# Patient Record
Sex: Male | Born: 2000 | Race: White | Hispanic: No | Marital: Single | Smoking: Never smoker
Health system: Southern US, Community
[De-identification: ages and names within clinical notes are randomized; demographics above are authoritative.]

## PROBLEM LIST (undated history)

## (undated) DIAGNOSIS — J302 Other seasonal allergic rhinitis: Secondary | ICD-10-CM

## (undated) HISTORY — PX: NO PAST SURGERIES: SHX2092

---
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
## 2018-11-12 DIAGNOSIS — M50223 Other cervical disc displacement at C6-C7 level: Secondary | ICD-10-CM | POA: Diagnosis not present

## 2018-11-12 DIAGNOSIS — M9901 Segmental and somatic dysfunction of cervical region: Secondary | ICD-10-CM | POA: Diagnosis not present

## 2018-11-12 DIAGNOSIS — M9902 Segmental and somatic dysfunction of thoracic region: Secondary | ICD-10-CM | POA: Diagnosis not present

## 2018-11-17 DIAGNOSIS — M50223 Other cervical disc displacement at C6-C7 level: Secondary | ICD-10-CM | POA: Diagnosis not present

## 2018-11-17 DIAGNOSIS — M9902 Segmental and somatic dysfunction of thoracic region: Secondary | ICD-10-CM | POA: Diagnosis not present

## 2018-11-17 DIAGNOSIS — M9901 Segmental and somatic dysfunction of cervical region: Secondary | ICD-10-CM | POA: Diagnosis not present

## 2018-11-19 DIAGNOSIS — M50223 Other cervical disc displacement at C6-C7 level: Secondary | ICD-10-CM | POA: Diagnosis not present

## 2018-11-19 DIAGNOSIS — M9902 Segmental and somatic dysfunction of thoracic region: Secondary | ICD-10-CM | POA: Diagnosis not present

## 2018-11-19 DIAGNOSIS — M9901 Segmental and somatic dysfunction of cervical region: Secondary | ICD-10-CM | POA: Diagnosis not present

## 2018-11-24 DIAGNOSIS — M9901 Segmental and somatic dysfunction of cervical region: Secondary | ICD-10-CM | POA: Diagnosis not present

## 2018-11-24 DIAGNOSIS — M50223 Other cervical disc displacement at C6-C7 level: Secondary | ICD-10-CM | POA: Diagnosis not present

## 2018-11-24 DIAGNOSIS — M9902 Segmental and somatic dysfunction of thoracic region: Secondary | ICD-10-CM | POA: Diagnosis not present

## 2018-11-26 DIAGNOSIS — M9901 Segmental and somatic dysfunction of cervical region: Secondary | ICD-10-CM | POA: Diagnosis not present

## 2018-11-26 DIAGNOSIS — M50223 Other cervical disc displacement at C6-C7 level: Secondary | ICD-10-CM | POA: Diagnosis not present

## 2018-11-26 DIAGNOSIS — M9902 Segmental and somatic dysfunction of thoracic region: Secondary | ICD-10-CM | POA: Diagnosis not present

## 2018-12-01 DIAGNOSIS — M9902 Segmental and somatic dysfunction of thoracic region: Secondary | ICD-10-CM | POA: Diagnosis not present

## 2018-12-01 DIAGNOSIS — M9901 Segmental and somatic dysfunction of cervical region: Secondary | ICD-10-CM | POA: Diagnosis not present

## 2018-12-01 DIAGNOSIS — M50223 Other cervical disc displacement at C6-C7 level: Secondary | ICD-10-CM | POA: Diagnosis not present

## 2019-09-13 ENCOUNTER — Other Ambulatory Visit: Payer: Self-pay

## 2019-09-13 DIAGNOSIS — Z20822 Contact with and (suspected) exposure to covid-19: Secondary | ICD-10-CM

## 2019-09-14 LAB — NOVEL CORONAVIRUS, NAA: SARS-CoV-2, NAA: NOT DETECTED

## 2020-09-28 ENCOUNTER — Ambulatory Visit (INDEPENDENT_AMBULATORY_CARE_PROVIDER_SITE_OTHER): Payer: BC Managed Care – PPO

## 2020-09-28 ENCOUNTER — Ambulatory Visit
Admission: RE | Admit: 2020-09-28 | Discharge: 2020-09-28 | Disposition: A | Discharge status: Home / Self Care | Payer: BC Managed Care – PPO | Source: Ambulatory Visit | Attending: Emergency Medicine | Admitting: Emergency Medicine

## 2020-09-28 ENCOUNTER — Other Ambulatory Visit: Payer: Self-pay

## 2020-09-28 VITALS — BP 136/74 | HR 68 | Temp 98.1°F | Resp 18 | Ht 69.0 in | Wt 190.0 lb

## 2020-09-28 DIAGNOSIS — S99922A Unspecified injury of left foot, initial encounter: Secondary | ICD-10-CM

## 2020-09-28 HISTORY — DX: Other seasonal allergic rhinitis: J30.2

## 2020-09-28 NOTE — ED Provider Notes (Signed)
HPI  SUBJECTIVE:  Todd Leblanc is a 19 y.o. male who presents with 2 days of dorsal left midfoot pain after 45 pound weight plate fell onto his bare foot.  He states the pain is getting better, and can walk on it now.  He reports bruising, erythema, which has resolved.  Denies swelling, distal numbness or tingling.  No injury to the ankle.  States that he is to move his toes without any problem.  He tried ice with improvement in his symptoms.  He has also stayed active.  Symptoms are worse with weightbearing.  Past medical history negative for left foot injury, diabetes, hypertension, osteoporosis, smoking.  PMD: Dr. Audelia Acton.   Past Medical History:  Diagnosis Date  . Seasonal allergies     Past Surgical History:  Procedure Laterality Date  . NO PAST SURGERIES      Family History  Problem Relation Age of Onset  . Healthy Mother   . Healthy Father     Social History   Tobacco Use  . Smoking status: Never Smoker  . Smokeless tobacco: Never Used  Vaping Use  . Vaping Use: Never used  Substance Use Topics  . Alcohol use: Yes    Comment: occasional  . Drug use: Never    No current facility-administered medications for this encounter.  Current Outpatient Medications:  .  cetirizine (ZYRTEC) 10 MG tablet, Take 1 tablet by mouth daily as needed., Disp: , Rfl:  .  triamcinolone (NASACORT) 55 MCG/ACT AERO nasal inhaler, Place 2 sprays into the nose daily as needed., Disp: , Rfl:   No Known Allergies   ROS  As noted in HPI.   Physical Exam  BP 136/74 (BP Location: Left Arm)   Pulse 68   Temp 98.1 F (36.7 C) (Oral)   Resp 18   Ht 5\' 9"  (1.753 m)   Wt 86.2 kg   SpO2 100%   BMI 28.06 kg/m   Constitutional: Well developed, well nourished, no acute distress Eyes:  EOMI, conjunctiva normal bilaterally HENT: Normocephalic, atraumatic,mucus membranes moist Respiratory: Normal inspiratory effort Cardiovascular: Normal rate GI: nondistended skin: No rash, skin  intact Musculoskeletal:  Left medial midfoot tender. Base of fifth metatarsal NT/ tender. No bruising. Skin intact. DP 2+. Refill less than 2 seconds. Sensation grossly intact. Patient able to move all toes actively.   no pain with inversion / eversion,  dorsiflexion / plantarflexion. No Tenderness along the plantar fascia. Distal fibula NT, Medial malleolus NT,  Deltoid ligament NT, Lateral ligaments NT, Achilles NT. Patient able to bear weight while in department. Neurologic: Alert & oriented x 3, no focal neuro deficits Psychiatric: Speech and behavior appropriate   ED Course   Medications - No data to display  Orders Placed This Encounter  Procedures  . DG Foot Complete Left    Standing Status:   Standing    Number of Occurrences:   1    Order Specific Question:   Reason for Exam (SYMPTOM  OR DIAGNOSIS REQUIRED)    Answer:   45 lb weight fell on foot DOI 09/26/20    No results found for this or any previous visit (from the past 24 hour(s)). DG Foot Complete Left  Result Date: 09/28/2020 CLINICAL DATA:  Injury to foot 1 week ago EXAM: LEFT FOOT - COMPLETE 3+ VIEW COMPARISON:  None. FINDINGS: There is no evidence of fracture or dislocation. There is no evidence of arthropathy or other focal bone abnormality. Mild soft tissue swelling over the  dorsum of the midfoot. IMPRESSION: Mild soft tissue swelling without evidence of acute fracture or dislocation. Electronically Signed   By: Duanne Guess D.O.   On: 09/28/2020 13:28    ED Clinical Impression  1. Injury of left foot, initial encounter      ED Assessment/Plan  Status post crush injury to the foot.  Will image foot to rule out partial fracture.  If negative, then patient can take Tylenol/ibuprofen together as needed for pain, resume activity as tolerated.  States he does not need a prescription for ibuprofen.  Reviewed imaging independently.  Mild soft tissue swelling.  No fracture or dislocation.  See radiology report for  full details.  Normal x-ray.  Plan as above.  Follow-up with PMD as needed.   No orders of the defined types were placed in this encounter.   *This clinic note was created using Dragon dictation software. Therefore, there may be occasional mistakes despite careful proofreading.   ?    Domenick Gong, MD 09/28/20 1404

## 2020-09-28 NOTE — ED Triage Notes (Signed)
Patient in today c/o left foot pain x 2 days. Patient states he was working out on 09/26/20 and dropped a 45lb weight onto his foot.

## 2020-09-28 NOTE — Discharge Instructions (Addendum)
Your x-ray was negative for fracture or dislocation.  You may take 600 mg of ibuprofen combined with 1000 mg of Tylenol 3-4 times a day as needed for pain.  Keep this in mind for the future.

## 2022-01-16 IMAGING — CR DG FOOT COMPLETE 3+V*L*
4 series · 4 of 4 positions shown · non-contrast
Comparison: None.

CLINICAL DATA: Injury to foot 1 week ago

EXAM:
LEFT FOOT - COMPLETE 3+ VIEW

[foot ap]
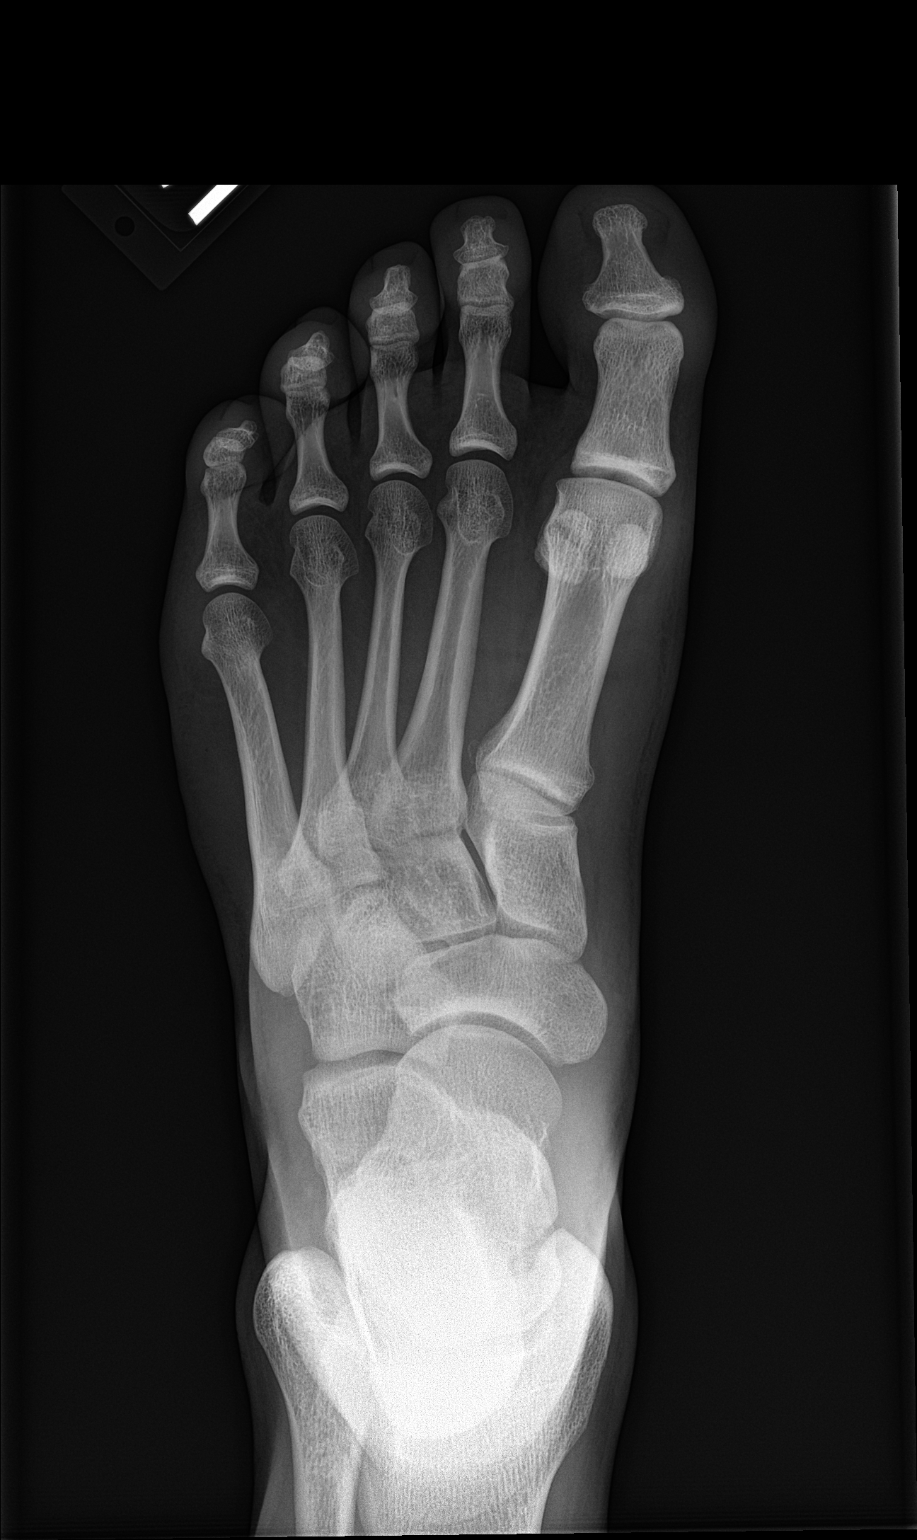

[foot obl]
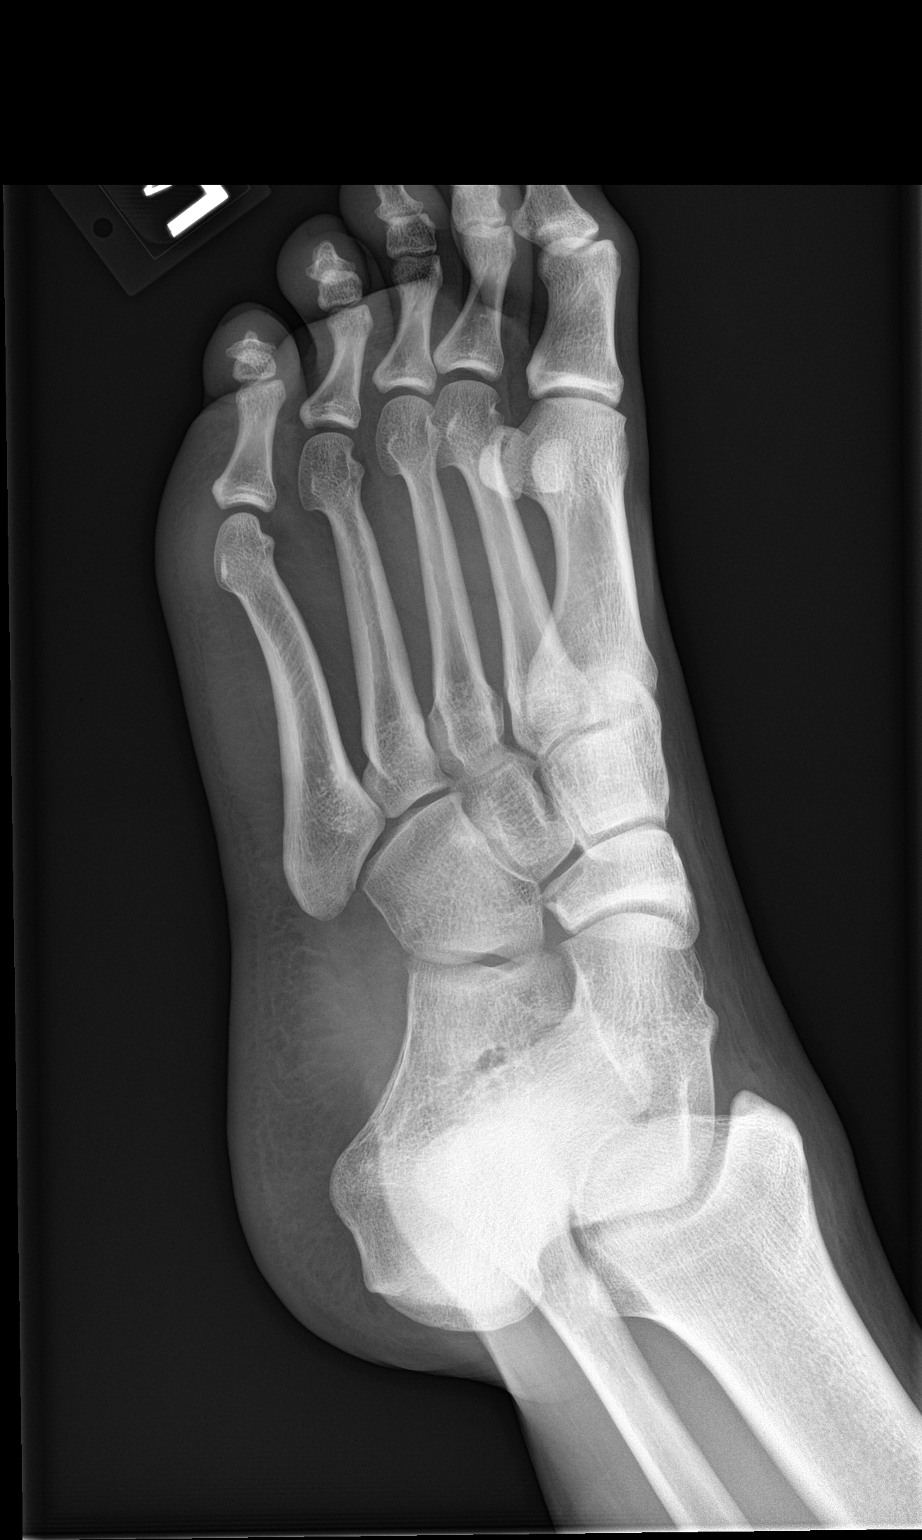

[foot lat (1 of 2)]
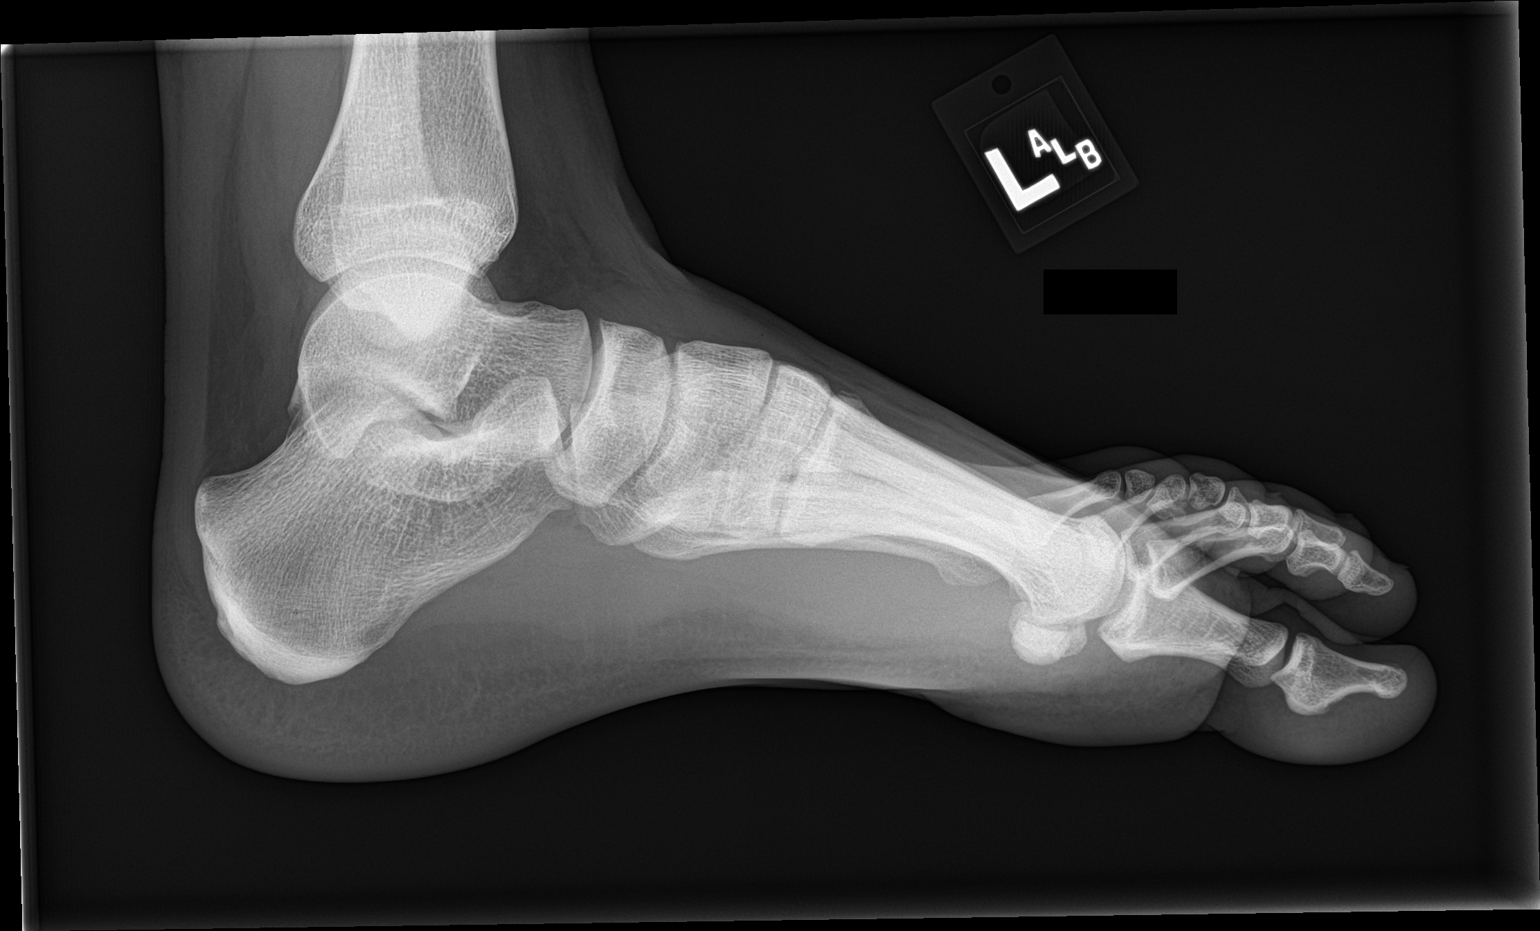

[foot lat (2 of 2)]
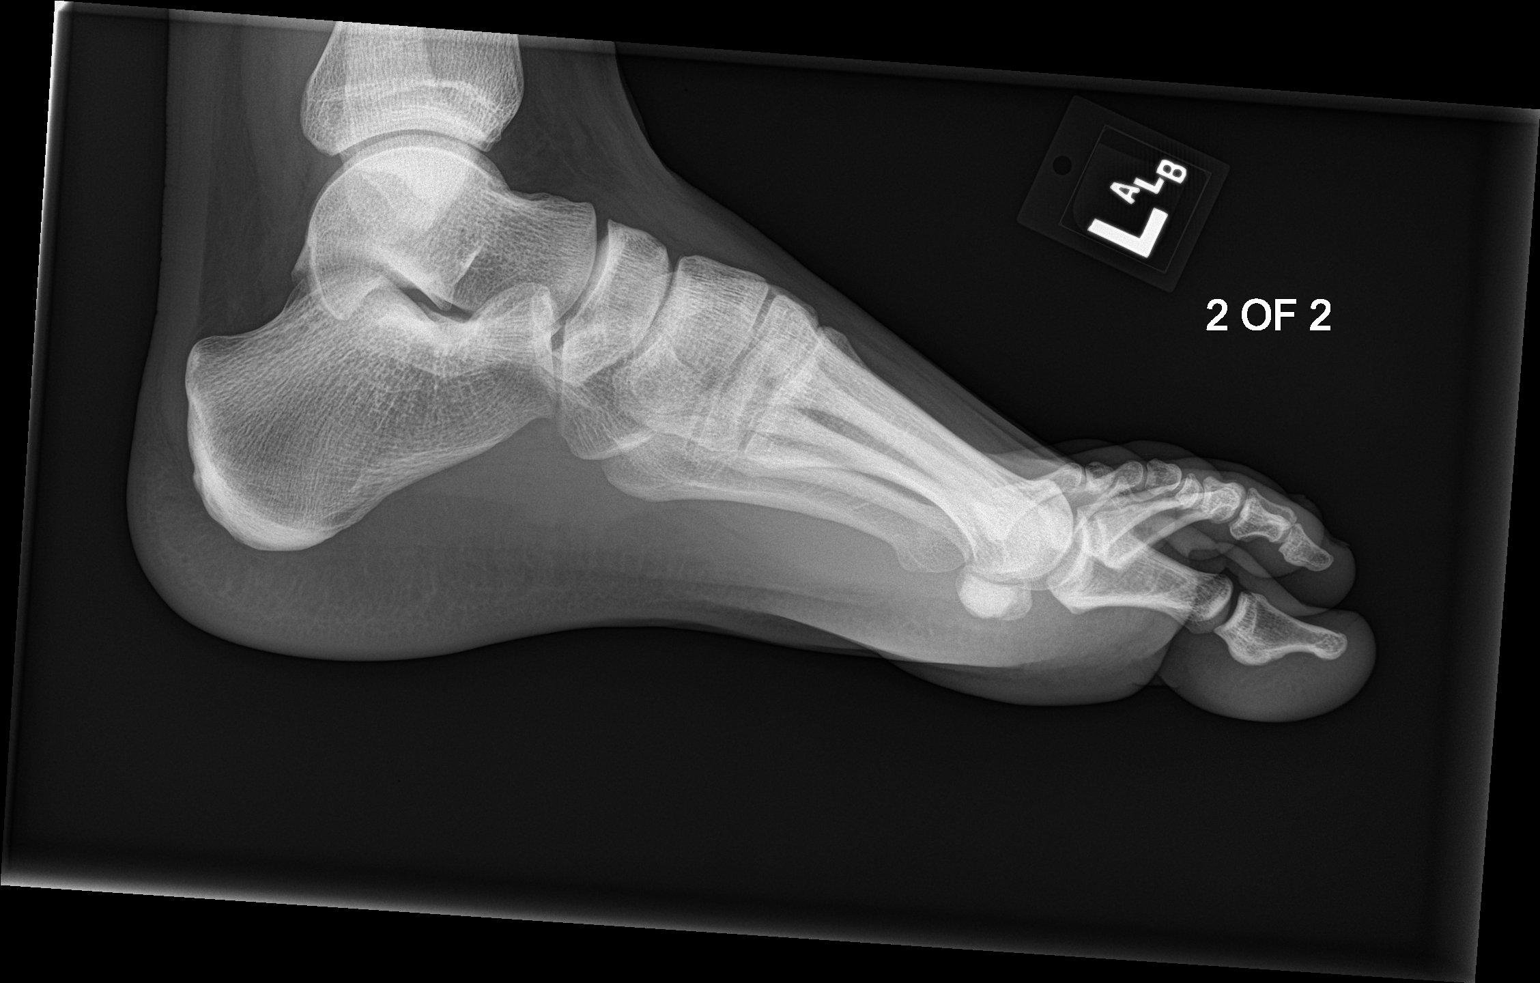

[4 of 4 positions shown; findings below may reference images not displayed]

FINDINGS: There is no evidence of fracture or dislocation. There is no
evidence of arthropathy or other focal bone abnormality. Mild soft
tissue swelling over the dorsum of the midfoot.
IMPRESSION: Mild soft tissue swelling without evidence of acute fracture or
dislocation.

## 2022-05-13 ENCOUNTER — Ambulatory Visit
Admission: EM | Admit: 2022-05-13 | Discharge: 2022-05-13 | Disposition: A | Discharge status: Home / Self Care | Payer: BC Managed Care – PPO | Attending: Emergency Medicine | Admitting: Emergency Medicine

## 2022-05-13 DIAGNOSIS — B360 Pityriasis versicolor: Secondary | ICD-10-CM

## 2022-05-13 MED ORDER — TERBINAFINE HCL 250 MG PO TABS: 250.0000 mg | ORAL_TABLET | Freq: Every day | ORAL | 0 refills | Status: AC

## 2022-05-13 NOTE — ED Triage Notes (Addendum)
Patient to UC with rash present to left side of face and left forearm. Patient reports competitng in martial arts and initially noticed an abrasion. Area progressed into rash and has continued to spread across his face two weeks ago. Reports area feels irritated.   Ointment started on Saturday evening.

## 2022-05-13 NOTE — ED Provider Notes (Signed)
[facility]    CSN: [id number] Arrival date & time: 05/13/22  1152      History   Chief Complaint Chief Complaint  Patient presents with   Rash    HPI Todd Leblanc is a 21 y.o. male.   HPI  20 year old male here for evaluation of skin complaints.  Patient reports that he has been experiencing a rash on his left forearm and on the left side of his face for the past 2 weeks.  He states that the areas do itch and he states it started out as what he thought might be abrasions but they have continued to spread on his face.  He denies any fever or drainage.  He does do martial arts to include jujitsu and kickboxing.  Past Medical History:  Diagnosis Date   Seasonal allergies     There are no problems to display for this patient.   Past Surgical History:  Procedure Laterality Date   NO PAST SURGERIES         Home Medications    Prior to Admission medications   Medication Sig Start Date End Date Taking? Authorizing Provider  terbinafine (LAMISIL) 250 MG tablet Take 1 tablet (250 mg total) by mouth daily for 21 days. 05/13/22 06/03/22 Yes Becky Augusta, NP  cetirizine (ZYRTEC) 10 MG tablet Take 1 tablet by mouth daily as needed.    [provider]    Family History Family History  Problem Relation Age of Onset   Healthy Mother    Healthy Father     Social History Social History   Tobacco Use   Smoking status: Never   Smokeless tobacco: Never  Vaping Use   Vaping Use: Never used  Substance Use Topics   Alcohol use: Yes    Comment: occasional   Drug use: Never     Allergies   Patient has no known allergies.   Review of Systems Review of Systems  Constitutional:  Negative for fever.  Skin:  Positive for color change and rash.  Hematological: Negative.   Psychiatric/Behavioral: Negative.       Physical Exam Triage Vital Signs ED Triage Vitals  Enc Vitals Group     BP 05/13/22 1214 138/65     Pulse Rate 05/13/22 1214 (!)  54     Resp 05/13/22 1214 18     Temp 05/13/22 1214 98.4 F (36.9 C)     Temp Source 05/13/22 1214 Oral     SpO2 05/13/22 1214 99 %     Weight 05/13/22 1215 180 lb (81.6 kg)     Height 05/13/22 1215 5\' 8"  (1.727 m)     Head Circumference --      Peak Flow --      Pain Score 05/13/22 1215 0     Pain Loc --      Pain Edu? --      Excl. in GC? --    No data found.  Updated Vital Signs BP 138/65   Pulse (!) 54   Temp 98.4 F (36.9 C) (Oral)   Resp 18   Ht 5\' 8"  (1.727 m)   Wt 180 lb (81.6 kg)   SpO2 99%   BMI 27.37 kg/m   Visual Acuity Right Eye Distance:   Left Eye Distance:   Bilateral Distance:    Right Eye Near:   Left Eye Near:    Bilateral Near:     Physical Exam Vitals and nursing note reviewed.  Constitutional:  Appearance: Normal appearance. He is not ill-appearing.  HENT:     Head: Normocephalic and atraumatic.  Skin:    General: Skin is warm and dry.     Capillary Refill: Capillary refill takes less than 2 seconds.     Findings: Rash present. No erythema.  Neurological:     General: No focal deficit present.     Mental Status: He is alert and oriented to person, place, and time.  Psychiatric:        Mood and Affect: Mood normal.        Behavior: Behavior normal.        Thought Content: Thought content normal.        Judgment: Judgment normal.         UC Treatments / Results  Labs (all labs ordered are listed, but only abnormal results are displayed) Labs Reviewed - No data to display  EKG   Radiology No results found.  Procedures Procedures (including critical care time)  Medications Ordered in UC Medications - No data to display  Initial Impression / Assessment and Plan / UC Course  I have reviewed the triage vital signs and the nursing notes.  Pertinent labs & imaging results that were available during my care of the patient were reviewed by me and considered in my medical decision making (see chart for  details).  Patient is a nontoxic-appearing 21 year old male here for evaluation of erythematous skin lesions on the left side of his face and left forearm that have been present for the last 2 weeks.  The lesion on the left forearm does have some scabbed areas but there is no edema, induration, fluctuance, or heat from the area.  It is a erythematous and scaly area on the skin as catalog in the images above.  The lesions on the face are scattered erythematous patches, some of them circular, but there is no central clearing with an annular raised edge.  The lesions do appear fungal and I will discharge patient home on terbinafine and treat him for tinea versicolor.  I will place him on the medication once daily for 3 weeks.  I have discussed signs and symptoms of secondary infection and told the patient to return if those conditions present himself is, or if the lesions do not resolve.   Final Clinical Impressions(s) / UC Diagnoses   Final diagnoses:  Tinea versicolor     Discharge Instructions      Take the Terbinafine daily for 3 weeks for treatment of the fungal infection on your face and left arm.  If you develop redness and swelling to the skin, red streaks, or fever return for re-evaluation.  Also return if your lesions do not resolve.     ED Prescriptions     Medication Sig Dispense Auth. Provider   terbinafine (LAMISIL) 250 MG tablet Take 1 tablet (250 mg total) by mouth daily for 21 days. 21 tablet Becky Augusta, NP      PDMP not reviewed this encounter.   Becky Augusta, NP 05/13/22 1234

## 2022-05-13 NOTE — Discharge Instructions (Addendum)
Take the Terbinafine daily for 3 weeks for treatment of the fungal infection on your face and left arm.  If you develop redness and swelling to the skin, red streaks, or fever return for re-evaluation.  Also return if your lesions do not resolve.
